# Patient Record
Sex: Male | Born: 2000
Health system: Southern US, Community
[De-identification: ages and names within clinical notes are randomized; demographics above are authoritative.]

## PROBLEM LIST (undated history)

## (undated) DIAGNOSIS — H1044 Vernal conjunctivitis: Secondary | ICD-10-CM

## (undated) DIAGNOSIS — T7840XA Allergy, unspecified, initial encounter: Secondary | ICD-10-CM

## (undated) DIAGNOSIS — F819 Developmental disorder of scholastic skills, unspecified: Secondary | ICD-10-CM

## (undated) DIAGNOSIS — L709 Acne, unspecified: Secondary | ICD-10-CM

## (undated) DIAGNOSIS — R4586 Emotional lability: Secondary | ICD-10-CM

## (undated) DIAGNOSIS — L309 Dermatitis, unspecified: Secondary | ICD-10-CM

## (undated) DIAGNOSIS — T2220XS Burn of second degree of shoulder and upper limb, except wrist and hand, unspecified site, sequela: Secondary | ICD-10-CM

## (undated) DIAGNOSIS — F4320 Adjustment disorder, unspecified: Secondary | ICD-10-CM

## (undated) DIAGNOSIS — F988 Other specified behavioral and emotional disorders with onset usually occurring in childhood and adolescence: Secondary | ICD-10-CM

## (undated) DIAGNOSIS — J45909 Unspecified asthma, uncomplicated: Secondary | ICD-10-CM

## (undated) HISTORY — DX: Developmental disorder of scholastic skills, unspecified: F81.9

## (undated) HISTORY — PX: CIRCUMCISION: SUR203

## (undated) HISTORY — DX: Vernal conjunctivitis: H10.44

## (undated) HISTORY — DX: Acne, unspecified: L70.9

## (undated) HISTORY — DX: Emotional lability: R45.86

## (undated) HISTORY — DX: Other specified behavioral and emotional disorders with onset usually occurring in childhood and adolescence: F98.8

## (undated) HISTORY — DX: Dermatitis, unspecified: L30.9

## (undated) HISTORY — DX: Burn of second degree of shoulder and upper limb, except wrist and hand, unspecified site, sequela: T22.20XS

## (undated) HISTORY — DX: Allergy, unspecified, initial encounter: T78.40XA

## (undated) HISTORY — DX: Adjustment disorder, unspecified: F43.20

---
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
## 2020-01-03 ENCOUNTER — Telehealth: Payer: Self-pay

## 2020-01-03 NOTE — Telephone Encounter (Signed)
Kenneth Davidson came in with his brother today. He stated that he had requested a dermatologist appointment and he never got it. I called the place that was listed in the referral notes and they are not accepting new patients. Could you please refer him somewhere else. Prefers something close to his home in [place] or [place].

## 2020-01-07 ENCOUNTER — Ambulatory Visit: Payer: Medicaid Other | Admitting: Family Medicine

## 2020-04-14 ENCOUNTER — Telehealth: Payer: Self-pay

## 2020-04-14 NOTE — Telephone Encounter (Signed)
Copied from CRM [id number]. Topic: Referral - Request for Referral >> Apr 13, 2020  3:56 PM Kenneth Davidson wrote: Has patient seen PCP for this complaint? Yes. Patients states PCP referred patient to a specialist before but they never received a call back.  *If NO, is insurance requiring patient see PCP for this issue before PCP can refer them? Referral for which specialty: Dermatolgist Preferred provider/office: Dr. Carmel[facility] Reason for referral: Keloids on both ears [phone]- (mother contact number)  Specialty Comments 01/24/2020 12:57 PM Marita Snellen, Nadyne Coombes, CMA - -  Note   Tommie Raymond, CMA  01/03/20 1:53 PM Note   I called the place that was listed in the referral notes and they are not accepting new patients. Could you please refer him somewhere else. Prefers something close to his home in [place] or [place].              Referral was closed due to the message listed above but will be reopened and sent to the preferred provider at [facility] (Dr. Joanna Puff).  General 04/14/2020 6:07 AM [place] Brooke Dare, Nadyne Coombes, CMA - -  Note   Referral has been sent to [facility]  P: [phone] F: [phone]  Release ID # [id number]

## 2021-06-27 DIAGNOSIS — L500 Allergic urticaria: Secondary | ICD-10-CM | POA: Diagnosis not present

## 2021-06-27 DIAGNOSIS — R001 Bradycardia, unspecified: Secondary | ICD-10-CM | POA: Diagnosis not present

## 2021-06-29 ENCOUNTER — Telehealth: Payer: Self-pay

## 2021-06-29 NOTE — Telephone Encounter (Signed)
Transition Care Management Unsuccessful Follow-up Telephone Call  Date of discharge and from where:  06/28/2021-[facility]   Attempts:  1st Attempt  Reason for unsuccessful TCM follow-up call:  Left voice message

## 2021-07-02 NOTE — Telephone Encounter (Signed)
Transition Care Management Unsuccessful Follow-up Telephone Call  Date of discharge and from where:  06/28/2021-[facility]   Attempts:  2nd Attempt  Reason for unsuccessful TCM follow-up call:  Left voice message

## 2021-07-03 NOTE — Telephone Encounter (Signed)
Transition Care Management Unsuccessful Follow-up Telephone Call  Date of discharge and from where:  06/28/2021 from [facility]  Attempts:  3rd Attempt  Reason for unsuccessful TCM follow-up call:  Missing or invalid number

## 2022-01-14 ENCOUNTER — Ambulatory Visit (LOCAL_COMMUNITY_HEALTH_CENTER): Payer: Self-pay

## 2022-01-14 ENCOUNTER — Other Ambulatory Visit: Payer: Self-pay

## 2022-01-14 DIAGNOSIS — Z111 Encounter for screening for respiratory tuberculosis: Secondary | ICD-10-CM

## 2022-01-17 ENCOUNTER — Other Ambulatory Visit: Payer: Self-pay

## 2022-01-17 ENCOUNTER — Ambulatory Visit (LOCAL_COMMUNITY_HEALTH_CENTER): Payer: Medicaid Other

## 2022-01-17 ENCOUNTER — Telehealth: Payer: Self-pay

## 2022-01-17 ENCOUNTER — Emergency Department
Admission: EM | Admit: 2022-01-17 | Discharge: 2022-01-18 | Disposition: A | Discharge status: Home / Self Care | Payer: Medicaid Other | Attending: Emergency Medicine | Admitting: Emergency Medicine

## 2022-01-17 DIAGNOSIS — T23131A Burn of first degree of multiple right fingers (nail), not including thumb, initial encounter: Secondary | ICD-10-CM

## 2022-01-17 DIAGNOSIS — J45909 Unspecified asthma, uncomplicated: Secondary | ICD-10-CM | POA: Diagnosis not present

## 2022-01-17 DIAGNOSIS — Z111 Encounter for screening for respiratory tuberculosis: Secondary | ICD-10-CM

## 2022-01-17 DIAGNOSIS — T23531A Corrosion of first degree of multiple right fingers (nail), not including thumb, initial encounter: Secondary | ICD-10-CM | POA: Insufficient documentation

## 2022-01-17 DIAGNOSIS — X12XXXA Contact with other hot fluids, initial encounter: Secondary | ICD-10-CM | POA: Diagnosis not present

## 2022-01-17 DIAGNOSIS — Y93G1 Activity, food preparation and clean up: Secondary | ICD-10-CM | POA: Insufficient documentation

## 2022-01-17 DIAGNOSIS — Y92000 Kitchen of unspecified non-institutional (private) residence as  the place of occurrence of the external cause: Secondary | ICD-10-CM | POA: Insufficient documentation

## 2022-01-17 DIAGNOSIS — T310 Burns involving less than 10% of body surface: Secondary | ICD-10-CM | POA: Diagnosis not present

## 2022-01-17 DIAGNOSIS — T594X1A Toxic effect of chlorine gas, accidental (unintentional), initial encounter: Secondary | ICD-10-CM | POA: Insufficient documentation

## 2022-01-17 DIAGNOSIS — T23101A Burn of first degree of right hand, unspecified site, initial encounter: Secondary | ICD-10-CM | POA: Diagnosis not present

## 2022-01-17 DIAGNOSIS — T23161A Burn of first degree of back of right hand, initial encounter: Secondary | ICD-10-CM | POA: Diagnosis present

## 2022-01-17 LAB — TB SKIN TEST
Induration: 0 mm
TB Skin Test: NEGATIVE

## 2022-01-17 MED ORDER — SILVER SULFADIAZINE 1 % EX CREA
TOPICAL_CREAM | Freq: Once | CUTANEOUS | Status: AC
  Administered 2022-01-17: 1 via TOPICAL
  Filled 2022-01-17: qty 20

## 2022-01-17 MED ORDER — IBUPROFEN 800 MG PO TABS
800.0000 mg | ORAL_TABLET | Freq: Once | ORAL | Status: AC
  Administered 2022-01-17: 800 mg via ORAL
  Filled 2022-01-17: qty 1

## 2022-01-17 NOTE — Telephone Encounter (Signed)
Call to client to remind him that TB skin test is due to be read today. Per client, he is on the way now. Jossie Ng, RN
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
## 2022-01-18 MED ORDER — SILVER SULFADIAZINE 1 % EX CREA: TOPICAL_CREAM | CUTANEOUS | 1 refills | Status: DC

## 2022-01-18 MED ORDER — IBUPROFEN 800 MG PO TABS: 800.0000 mg | ORAL_TABLET | Freq: Three times a day (TID) | ORAL | 0 refills | Status: DC | PRN
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
## 2022-01-21 ENCOUNTER — Telehealth: Payer: Self-pay

## 2022-01-21 NOTE — Telephone Encounter (Signed)
Transition Care Management Follow-up Telephone Call Date of discharge and from where: 01/18/2022 from [facility] How have you been since you were released from the hospital? Patient stated that he is doing well. Patient informed rx was [facility] Patient asked about transfering rx to [facility]. Informed patient to have the [facility] from the [facility].  Any questions or concerns? No  Items Reviewed: Did the pt receive and understand the discharge instructions provided? Yes  Medications obtained and verified? Yes  Other? No  Any new allergies since your discharge? No  Dietary orders reviewed? No Do you have support at home? Yes   Functional Questionnaire: (I = Independent and D = Dependent) ADLs: I  Bathing/Dressing- I  Meal Prep- I  Eating- I  Maintaining continence- I  Transferring/Ambulation- I  Managing Meds- I   Follow up appointments reviewed:  PCP Hospital f/u appt confirmed? No   Specialist Hospital f/u appt confirmed? No   Are transportation arrangements needed? No  If their condition worsens, is the pt aware to call PCP or go to the Emergency Dept.? Yes Was the patient provided with contact information for the PCP's office or ED? Yes Was to pt encouraged to call back with questions or concerns? Yes

## 2022-05-17 ENCOUNTER — Ambulatory Visit: Payer: Medicaid Other | Admitting: Family Medicine

## 2022-05-17 ENCOUNTER — Encounter: Payer: Self-pay | Admitting: Family Medicine

## 2022-05-17 VITALS — BP 112/62 | HR 82 | Resp 16 | Ht 66.0 in | Wt 152.0 lb

## 2022-05-17 DIAGNOSIS — F341 Dysthymic disorder: Secondary | ICD-10-CM | POA: Diagnosis not present

## 2022-05-17 DIAGNOSIS — L910 Hypertrophic scar: Secondary | ICD-10-CM | POA: Diagnosis not present

## 2022-05-17 DIAGNOSIS — J302 Other seasonal allergic rhinitis: Secondary | ICD-10-CM

## 2022-05-17 DIAGNOSIS — L2082 Flexural eczema: Secondary | ICD-10-CM | POA: Diagnosis not present

## 2022-05-17 DIAGNOSIS — J3089 Other allergic rhinitis: Secondary | ICD-10-CM | POA: Diagnosis not present

## 2022-05-17 DIAGNOSIS — J4520 Mild intermittent asthma, uncomplicated: Secondary | ICD-10-CM

## 2022-05-17 MED ORDER — TRIAMCINOLONE ACETONIDE 0.1 % EX CREA: 1.0000 | TOPICAL_CREAM | Freq: Two times a day (BID) | CUTANEOUS | 0 refills | Status: DC

## 2022-05-17 MED ORDER — LORATADINE 10 MG PO TABS: 10.0000 mg | ORAL_TABLET | Freq: Every day | ORAL | 5 refills | Status: DC

## 2022-05-17 MED ORDER — ALBUTEROL SULFATE HFA 108 (90 BASE) MCG/ACT IN AERS: 2.0000 | INHALATION_SPRAY | RESPIRATORY_TRACT | 0 refills | Status: DC | PRN

## 2022-08-21 NOTE — Progress Notes (Signed)
Adolescent Well Care Visit Kenneth Davidson is a 21 y.o. male who is here for well care.     PCP:  Alba Cory, MD   History was provided by the patient.  Confidentiality was discussed with the patient and, if applicable, with caregiver as well. Patient's personal or confidential phone number:    Current Issues: Current concerns include grieving, lost his father and his paternal uncle and is sad.   Nutrition: Nutrition/Eating Behaviors: eats at home, he likes fruit and vegetables - he has a garden, protein bars  Adequate calcium in diet?: discussed high calcium diet  Supplements/ Vitamins: none   Exercise/ Media: Play any Sports?:  none Exercise:   landscaper, very active job  Screen Time:  < 2 hours Media Rules or Monitoring?: no  Sleep:  Sleep: 7 hours   Social Screening: Lives with:  mother, youngest brother and one sister  Parental relations:  good - father died recently  Activities, Work, and Regulatory affairs officer?: he has his own job  Concerns regarding behavior with peers?  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  Yes, vaping, discussed importance of quitting  Secondhand smoke exposure?  no Drugs/ETOH?  no  Sexually Active?  Not currently    Pregnancy Prevention: used condoms in the past   Safe at home, in school & in relationships?  Yes Safe to self?  Yes   Screenings:  The patient completed the Rapid Assessment for Adolescent Preventive Services screening questionnaire and the following topics were identified as risk factors and discussed: mental health issues  In addition, the following topics were discussed as part of anticipatory guidance tobacco use.  PHQ-9 completed and results indicated   Flowsheet Row Office Visit from 08/22/2022 in [facility]  PHQ-9 Total Score 0         Physical Exam:  Vitals:   08/22/22 1028  BP: 110/64  Pulse: 97  Resp: 16  SpO2: 98%  Weight: 150 lb (68 kg)  Height: 5\' 7"  (1.702 m)   BP 110/64   Pulse 97   Resp 16   Ht 5\' 7"  (1.702 m)   Wt 150 lb (68 kg)   SpO2 98%   BMI 23.49 kg/m  Body mass index: body mass index is 23.49 kg/m. Growth %ile SmartLinks can only be used for patients less than 46 years old.  No results found.  Physical Exam  Constitutional: Patient appears well-developed and well-nourished. No distress.  HENT: Head: Normocephalic and atraumatic. Ears: B TMs ok, no erythema or effusion; Nose: Nose normal. Mouth/Throat: Oropharynx is clear and moist. No oropharyngeal exudate.  Eyes: Conjunctivae and EOM are normal. Pupils are equal, round, and reactive to light. No scleral icterus.  Neck: Normal range of motion. Neck supple. No JVD present. No thyromegaly present.  Cardiovascular: Normal rate, regular rhythm and normal heart sounds.  No murmur heard. No BLE edema. Pulmonary/Chest: Effort normal and breath sounds normal. No respiratory distress. Abdominal: Soft. Bowel sounds are normal, no distension. There is no tenderness. no masses MALE GENITALIA: Normal descended testes bilaterally, no masses palpated, no hernias, no lesions, no discharge RECTAL: not done  Musculoskeletal: Normal range of motion, no joint effusions. No gross deformities Neurological: he is alert and oriented to person, place, and time. No cranial nerve deficit. Coordination, balance, strength, speech and gait  are normal.  Skin: keloid ear lobes  Psychiatric: Patient has a normal mood and affect. behavior is normal. Judgment and thought content normal.   Assessment and Plan:   1. Well adult exam  - Meningococcal MCV4O(Menveo) - Pneumococcal conjugate vaccine 20-valent (Prevnar 20) - Lipid panel - CBC with Differential/Platelet - COMPLETE METABOLIC PANEL WITH GFR - Hemoglobin A1c - Hepatitis C antibody - HIV Antibody (routine  testing w rflx) - RPR - Cytology (oral, anal, urethral) ancillary only  2. Need for immunization against influenza  - Flu Vaccine QUAD 6+ mos PF IM (Fluarix Quad PF)  3. Need for pneumococcal 20-valent conjugate vaccination  - Pneumococcal conjugate vaccine 20-valent (Prevnar 20)  4. Need for vaccination for meningococcus  - Meningococcal MCV4O(Menveo)  5. Diabetes mellitus screening  - Hemoglobin A1c  6. Screening cholesterol level  - Lipid panel  7. Routine screening for STI (sexually transmitted infection)  - HIV Antibody (routine testing w rflx) - RPR - Cytology (oral, anal, urethral) ancillary only  8. Need for hepatitis C screening test  - Hepatitis C antibody  9. History of anemia  - CBC with Differential/Platelet   BMI is appropriate for age   Counseling provided for the following PCV, flu and meningococcal   vaccine components  Orders Placed This Encounter  Procedures   Flu Vaccine QUAD 6+ mos PF IM (Fluarix Quad PF)   Meningococcal MCV4O(Menveo)   Pneumococcal conjugate vaccine 20-valent (Prevnar 20)   Lipid panel   CBC with Differential/Platelet   COMPLETE METABOLIC PANEL WITH GFR   Hemoglobin A1c   Hepatitis C antibody   HIV Antibody (routine testing w rflx)   RPR     Return in about 1 year (around 08/23/2023) for CPE, sooner for regular follow up and grieving .Loistine Chance, MD

## 2022-08-21 NOTE — Patient Instructions (Signed)
Preventive Care 21-21 Years Old, Male Preventive care refers to lifestyle choices and visits with your health care provider that can promote health and wellness. Preventive care visits are also called wellness exams. What can I expect for my preventive care visit? Counseling During your preventive care visit, your health care provider may ask about your: Medical history, including: Past medical problems. Family medical history. Current health, including: Emotional well-being. Home life and relationship well-being. Sexual activity. Lifestyle, including: Alcohol, nicotine or tobacco, and drug use. Access to firearms. Diet, exercise, and sleep habits. Safety issues such as seatbelt and bike helmet use. Sunscreen use. Work and work environment. Physical exam Your health care provider may check your: Height and weight. These may be used to calculate your BMI (body mass index). BMI is a measurement that tells if you are at a healthy weight. Waist circumference. This measures the distance around your waistline. This measurement also tells if you are at a healthy weight and may help predict your risk of certain diseases, such as type 2 diabetes and high blood pressure. Heart rate and blood pressure. Body temperature. Skin for abnormal spots. What immunizations do I need?  Vaccines are usually given at various ages, according to a schedule. Your health care provider will recommend vaccines for you based on your age, medical history, and lifestyle or other factors, such as travel or where you work. What tests do I need? Screening Your health care provider may recommend screening tests for certain conditions. This may include: Lipid and cholesterol levels. Diabetes screening. This is done by checking your blood sugar (glucose) after you have not eaten for a while (fasting). Hepatitis B test. Hepatitis C test. HIV (human immunodeficiency virus) test. STI (sexually transmitted infection)  testing, if you are at risk. Talk with your health care provider about your test results, treatment options, and if necessary, the need for more tests. Follow these instructions at home: Eating and drinking  Eat a healthy diet that includes fresh fruits and vegetables, whole grains, lean protein, and low-fat dairy products. Drink enough fluid to keep your urine pale yellow. Take vitamin and mineral supplements as recommended by your health care provider. Do not drink alcohol if your health care provider tells you not to drink. If you drink alcohol: Limit how much you have to 0-2 drinks a day. Know how much alcohol is in your drink. In the [place] one drink equals one 12 oz bottle of beer (355 mL), one 5 oz glass of wine (148 mL), or one 1 oz glass of hard liquor (44 mL). Lifestyle Brush your teeth every morning and night with fluoride toothpaste. Floss one time each day. Exercise for at least 30 minutes 5 or more days each week. Do not use any products that contain nicotine or tobacco. These products include cigarettes, chewing tobacco, and vaping devices, such as e-cigarettes. If you need help quitting, ask your health care provider. Do not use drugs. If you are sexually active, practice safe sex. Use a condom or other form of protection to prevent STIs. Find healthy ways to manage stress, such as: Meditation, yoga, or listening to music. Journaling. Talking to a trusted person. Spending time with friends and family. Minimize exposure to UV radiation to reduce your risk of skin cancer. Safety Always wear your seat belt while driving or riding in a vehicle. Do not drive: If you have been drinking alcohol. Do not ride with someone who has been drinking. If you have been using any mind-altering substances   or drugs. While texting. When you are tired or distracted. Wear a helmet and other protective equipment during sports activities. If you have firearms in your house, make sure you  follow all gun safety procedures. Seek help if you have been physically or sexually abused. What's next? Go to your health care provider once a year for an annual wellness visit. Ask your health care provider how often you should have your eyes and teeth checked. Stay up to date on all vaccines. This information is not intended to replace advice given to you by your health care provider. Make sure you discuss any questions you have with your health care provider. Document Revised: 05/09/2021 Document Reviewed: 05/09/2021 Elsevier Patient Education  2023 Elsevier Inc.  

## 2022-08-22 ENCOUNTER — Ambulatory Visit (INDEPENDENT_AMBULATORY_CARE_PROVIDER_SITE_OTHER): Payer: Medicaid Other | Admitting: Family Medicine

## 2022-08-22 ENCOUNTER — Other Ambulatory Visit (HOSPITAL_COMMUNITY)
Admission: RE | Admit: 2022-08-22 | Discharge: 2022-08-22 | Disposition: A | Discharge status: Home / Self Care | Payer: Medicaid Other | Source: Ambulatory Visit | Attending: Family Medicine | Admitting: Family Medicine

## 2022-08-22 ENCOUNTER — Encounter: Payer: Self-pay | Admitting: Family Medicine

## 2022-08-22 VITALS — BP 110/64 | HR 97 | Resp 16 | Ht 67.0 in | Wt 150.0 lb

## 2022-08-22 DIAGNOSIS — Z13220 Encounter for screening for lipoid disorders: Secondary | ICD-10-CM | POA: Diagnosis not present

## 2022-08-22 DIAGNOSIS — Z862 Personal history of diseases of the blood and blood-forming organs and certain disorders involving the immune mechanism: Secondary | ICD-10-CM | POA: Diagnosis not present

## 2022-08-22 DIAGNOSIS — Z131 Encounter for screening for diabetes mellitus: Secondary | ICD-10-CM | POA: Diagnosis not present

## 2022-08-22 DIAGNOSIS — Z23 Encounter for immunization: Secondary | ICD-10-CM

## 2022-08-22 DIAGNOSIS — Z1159 Encounter for screening for other viral diseases: Secondary | ICD-10-CM | POA: Diagnosis not present

## 2022-08-22 DIAGNOSIS — Z113 Encounter for screening for infections with a predominantly sexual mode of transmission: Secondary | ICD-10-CM | POA: Insufficient documentation

## 2022-08-22 DIAGNOSIS — Z0000 Encounter for general adult medical examination without abnormal findings: Secondary | ICD-10-CM

## 2022-08-23 LAB — CYTOLOGY, (ORAL, ANAL, URETHRAL) ANCILLARY ONLY
Chlamydia: NEGATIVE
Comment: NEGATIVE
Comment: NORMAL
Neisseria Gonorrhea: NEGATIVE

## 2022-08-23 NOTE — Progress Notes (Unsigned)
Name: Kenneth Davidson   MRN: [id number]    DOB: 2001/05/29   Date:08/23/2022       Progress Note  Subjective  Chief Complaint  Follow Up  HPI  Keloid ear lobes: he had both ears pierced at age 21 and about one year later noticed that it was itchy and formed keloid that is painful and itchy, right side has improved but left side is still very large and would like to see dermatologist   Acne :doing better with mild soap at home.   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 No dizziness or cough but would like a refill of albuterol   AR: symptoms worse in the spring, doing well now , denies itchy eyes, or rhinorrhea.   Depression: phq 9 is 6 today , some of the points because of ADD, feels bad about himself , he gest on his house when not doing anything. He still feels bad about the fact that he cannot read well, he does games to help him read better but gets frustrated at times.   Patient Active Problem List   Diagnosis Date Noted   Keloid scar 05/17/2022   DDD (degenerative disc disease), lumbar 11/03/2018   Lumbar spondylosis 11/03/2018   Intermittent low back pain 03/28/2016   Cystic acne 10/02/2015   ADD (attention deficit disorder) 09/10/2015   Dermatitis, eczematoid 09/10/2015   Mild intermittent asthma with allergic rhinitis without complication 25/42/7062   Mood changes 09/10/2015   Perennial allergic rhinitis with seasonal variation 09/10/2015    Past Surgical History:  Procedure Laterality Date   CIRCUMCISION      Family History  Problem Relation Age of Onset   Depression Mother    Heart failure Father    Heart disease Father    Heart attack Father    Depression Sister     Social History   Tobacco Use   Smoking status: Never   Smokeless tobacco: Never  Substance Use Topics   Alcohol use: No    Alcohol/week: 0.0 standard drinks of alcohol     Current Outpatient Medications:     albuterol (PROAIR HFA) 108 (90 Base) MCG/ACT inhaler, Inhale 2 puffs into the lungs every 4 (four) hours as needed., Disp: 18 g, Rfl: 0   loratadine (CLARITIN) 10 MG tablet, Take 1 tablet (10 mg total) by mouth daily., Disp: 30 tablet, Rfl: 5   triamcinolone cream (KENALOG) 0.1 %, Apply 1 Application topically 2 (two) times daily., Disp: 453.6 g, Rfl: 0  Allergies  Allergen Reactions   Other     ANTS- "bumps", throat itches and swelling  BLEACH- skin "peels"   Bee Venom Swelling    I personally reviewed active problem list, medication list, allergies, family history, social history, health maintenance with the patient/caregiver today.   ROS  ***  Objective  There were no vitals filed for this visit.  There is no height or weight on file to calculate BMI.  Physical Exam ***  No results found for this or any previous visit (from the past 2160 hour(s)).   PHQ2/9:    08/22/2022   10:27 AM 05/17/2022    3:22 PM 07/06/2019    9:06 AM 07/15/2018    8:49 AM 07/15/2018    8:48 AM  Depression screen PHQ 2/9  Decreased Interest 0 0 1  0  Down, Depressed, Hopeless 0 2 3  0  PHQ - 2 Score 0 2 4  0  Altered sleeping 0 0 0 0   Tired, decreased energy 0 0 0 0   Change in appetite 0 0 0 0   Feeling bad or failure about yourself  0 2 2 0   Trouble concentrating 0 2 3 0   Moving slowly or fidgety/restless 0 0 0 0   Suicidal thoughts 0 0 0 0   PHQ-9 Score 0 6 9    Difficult doing work/chores   Very difficult Not difficult at all     phq 9 is {gen pos [id number]   Fall Risk:    08/22/2022   10:27 AM 05/17/2022    3:22 PM 07/06/2019    9:06 AM 07/15/2018    8:48 AM 03/28/2016    2:47 PM  Fall Risk   Falls in the past year? 0 0 0 No Yes  Number falls in past yr: 0 0 0  1  Injury with Fall? 0 0 0  Yes  Risk for fall due to : No Fall Risks No Fall Risks     Follow up Falls prevention discussed Falls prevention discussed         Functional Status Survey:      Assessment &  Plan  *** There are no diagnoses linked to this encounter.

## 2022-08-26 ENCOUNTER — Ambulatory Visit: Payer: Medicaid Other | Admitting: Family Medicine

## 2022-08-26 ENCOUNTER — Encounter: Payer: Self-pay | Admitting: Family Medicine

## 2022-08-26 VITALS — BP 118/66 | HR 84 | Resp 16 | Ht 67.0 in | Wt 150.0 lb

## 2022-08-26 DIAGNOSIS — Z113 Encounter for screening for infections with a predominantly sexual mode of transmission: Secondary | ICD-10-CM | POA: Diagnosis not present

## 2022-08-26 DIAGNOSIS — Z862 Personal history of diseases of the blood and blood-forming organs and certain disorders involving the immune mechanism: Secondary | ICD-10-CM | POA: Diagnosis not present

## 2022-08-26 DIAGNOSIS — Z91038 Other insect allergy status: Secondary | ICD-10-CM | POA: Diagnosis not present

## 2022-08-26 DIAGNOSIS — J4530 Mild persistent asthma, uncomplicated: Secondary | ICD-10-CM

## 2022-08-26 DIAGNOSIS — J3089 Other allergic rhinitis: Secondary | ICD-10-CM | POA: Diagnosis not present

## 2022-08-26 DIAGNOSIS — F341 Dysthymic disorder: Secondary | ICD-10-CM | POA: Insufficient documentation

## 2022-08-26 DIAGNOSIS — Z1159 Encounter for screening for other viral diseases: Secondary | ICD-10-CM | POA: Diagnosis not present

## 2022-08-26 DIAGNOSIS — J302 Other seasonal allergic rhinitis: Secondary | ICD-10-CM

## 2022-08-26 DIAGNOSIS — Z0000 Encounter for general adult medical examination without abnormal findings: Secondary | ICD-10-CM | POA: Diagnosis not present

## 2022-08-26 DIAGNOSIS — Z13220 Encounter for screening for lipoid disorders: Secondary | ICD-10-CM | POA: Diagnosis not present

## 2022-08-26 DIAGNOSIS — L910 Hypertrophic scar: Secondary | ICD-10-CM

## 2022-08-26 DIAGNOSIS — Z131 Encounter for screening for diabetes mellitus: Secondary | ICD-10-CM | POA: Diagnosis not present

## 2022-08-26 LAB — CBC WITH DIFFERENTIAL/PLATELET
Basophils Absolute: 22 cells/uL (ref 0–200)
Basophils Relative: 0.5 %
HCT: 41 % (ref 38.5–50.0)
Lymphs Abs: 1355 cells/uL (ref 850–3900)
MCHC: 35.4 g/dL (ref 32.0–36.0)
MCV: 97.2 fL (ref 80.0–100.0)
MPV: 10.4 fL (ref 7.5–12.5)
Neutro Abs: 2408 cells/uL (ref 1500–7800)
Total Lymphocyte: 31.5 %
WBC: 4.3 10*3/uL (ref 3.8–10.8)

## 2022-08-26 MED ORDER — VENTOLIN HFA 108 (90 BASE) MCG/ACT IN AERS: 2.0000 | INHALATION_SPRAY | RESPIRATORY_TRACT | 0 refills | Status: DC | PRN

## 2022-08-26 MED ORDER — MOMETASONE FURO-FORMOTEROL FUM 100-5 MCG/ACT IN AERO: 2.0000 | INHALATION_SPRAY | Freq: Two times a day (BID) | RESPIRATORY_TRACT | 2 refills | Status: DC

## 2022-08-26 MED ORDER — EPINEPHRINE 0.3 MG/0.3ML IJ SOAJ: 0.3000 mg | INTRAMUSCULAR | 1 refills | Status: DC | PRN

## 2022-08-27 LAB — HEMOGLOBIN A1C
Hgb A1c MFr Bld: 4.6 % of total Hgb (ref <5.7)
Mean Plasma Glucose: 85 mg/dL
eAG (mmol/L): 4.7 mmol/L

## 2022-08-27 LAB — CBC WITH DIFFERENTIAL/PLATELET
Absolute Monocytes: 396 cells/uL (ref 200–950)
Eosinophils Absolute: 120 cells/uL (ref 15–500)
Eosinophils Relative: 2.8 %
Hemoglobin: 14.5 g/dL (ref 13.2–17.1)
MCH: 34.4 pg — ABNORMAL HIGH (ref 27.0–33.0)
Monocytes Relative: 9.2 %
Neutrophils Relative %: 56 %
Platelets: 244 10*3/uL (ref 140–400)
RBC: 4.22 10*6/uL (ref 4.20–5.80)
RDW: 12.1 % (ref 11.0–15.0)

## 2022-08-27 LAB — COMPLETE METABOLIC PANEL WITH GFR
AG Ratio: 1.7 (calc) (ref 1.0–2.5)
ALT: 11 U/L (ref 9–46)
AST: 15 U/L (ref 10–40)
Albumin: 4 g/dL (ref 3.6–5.1)
Alkaline phosphatase (APISO): 56 U/L (ref 36–130)
BUN: 12 mg/dL (ref 7–25)
CO2: 26 mmol/L (ref 20–32)
Calcium: 9.1 mg/dL (ref 8.6–10.3)
Chloride: 105 mmol/L (ref 98–110)
Creat: 0.94 mg/dL (ref 0.60–1.24)
Globulin: 2.3 g/dL (calc) (ref 1.9–3.7)
Glucose, Bld: 81 mg/dL (ref 65–139)
Potassium: 4.2 mmol/L (ref 3.5–5.3)
Sodium: 140 mmol/L (ref 135–146)
Total Bilirubin: 1.6 mg/dL — ABNORMAL HIGH (ref 0.2–1.2)
Total Protein: 6.3 g/dL (ref 6.1–8.1)
eGFR: 118 mL/min/{1.73_m2} (ref >=60)

## 2022-08-27 LAB — LIPID PANEL
Cholesterol: 150 mg/dL (ref <200)
HDL: 64 mg/dL (ref >=40)
LDL Cholesterol (Calc): 70 mg/dL (calc)
Non-HDL Cholesterol (Calc): 86 mg/dL (calc) (ref <130)
Total CHOL/HDL Ratio: 2.3 (calc) (ref <5.0)
Triglycerides: 76 mg/dL (ref <150)

## 2022-08-27 LAB — HIV ANTIBODY (ROUTINE TESTING W REFLEX): HIV 1&2 Ab, 4th Generation: NONREACTIVE

## 2022-08-27 LAB — HEPATITIS C ANTIBODY: Hepatitis C Ab: NONREACTIVE

## 2022-08-27 LAB — RPR: RPR Ser Ql: NONREACTIVE

## 2022-11-19 NOTE — Progress Notes (Unsigned)
Name: Kenneth Davidson   MRN: [id number]    DOB: July 07, 2001   Date:11/20/2022       Progress Note  Subjective  Chief Complaint  Follow Up  HPI  Keloid ear lobes: he had both ears pierced at age 21 and about one year later noticed that it was itchy and formed keloid that is painful and itchy, right side has improved but left side is still very large, he is waiting for Dermatologist visit   Asthma mild intermittent:he has not been using Dulera or Albuterol lately, he states used to cough with weather change but not lately. Denies wheezing or SOB  Eczema : he is able to control symptoms with topical medication prn, currently doing well but needs refills   AR: symptoms worse in the  Spring and Fall. Currently doing well but needs refill of medications   Depression: phq 9 is normal now , he still feels better about himself, no longer having lack of motivation or energy.   Patient Active Problem List   Diagnosis Date Noted   Allergic to insect bites 08/26/2022   Dysthymia 08/26/2022   Keloid scar 05/17/2022   DDD (degenerative disc disease), lumbar 11/03/2018   Lumbar spondylosis 11/03/2018   Intermittent low back pain 03/28/2016   Cystic acne 10/02/2015   ADD (attention deficit disorder) 09/10/2015   Dermatitis, eczematoid 09/10/2015   Mild intermittent asthma with allergic rhinitis without complication 19/37/9024   Mood changes 09/10/2015   Perennial allergic rhinitis with seasonal variation 09/10/2015    Past Surgical History:  Procedure Laterality Date   CIRCUMCISION      Family History  Problem Relation Age of Onset   Depression Mother    Heart failure Father    Heart disease Father    Heart attack Father    Depression Sister     Social History   Tobacco Use   Smoking status: Never   Smokeless tobacco: Never  Substance Use Topics   Alcohol use: No    Alcohol/week: 0.0 standard drinks of alcohol     Current Outpatient Medications:    albuterol (VENTOLIN  HFA) 108 (90 Base) MCG/ACT inhaler, Inhale 2 puffs into the lungs every 4 (four) hours as needed for wheezing or shortness of breath., Disp: 18 g, Rfl: 0   EPINEPHrine 0.3 mg/0.3 mL IJ SOAJ injection, Inject 0.3 mg into the muscle as needed for anaphylaxis., Disp: 2 each, Rfl: 1   mometasone-formoterol (DULERA) 100-5 MCG/ACT AERO, Inhale 2 puffs into the lungs 2 (two) times daily., Disp: 13 g, Rfl: 2   loratadine (CLARITIN) 10 MG tablet, Take 1 tablet (10 mg total) by mouth daily., Disp: 30 tablet, Rfl: 5   triamcinolone cream (KENALOG) 0.1 %, Apply 1 Application topically 2 (two) times daily., Disp: 453.6 g, Rfl: 0  Allergies  Allergen Reactions   Other     ANTS- "bumps", throat itches and swelling  BLEACH- skin "peels"   Bee Venom Swelling    I personally reviewed active problem list, medication list, allergies, family history, social history, health maintenance with the patient/caregiver today.   ROS  Ten systems reviewed and is negative except as mentioned in HPI   Objective  Vitals:   11/20/22 1505  BP: 128/78  Pulse: 76  Resp: 14  Temp: 98.3 F (36.8 C)  TempSrc: Oral  SpO2: 97%  Weight: 159 lb 9.6 oz (72.4 kg)  Height: _0  (1.702 m)    Body mass index is 25 kg/m.  Physical Exam  Constitutional: Patient appears well-developed and well-nourished.  No distress.  HEENT: head atraumatic, normocephalic, pupils equal and reactive to light, neck supple Cardiovascular: Normal rate, regular rhythm and normal heart sounds.  No murmur heard. No BLE edema. Pulmonary/Chest: Effort normal and breath sounds normal. No respiratory distress. Abdominal: Soft.  There is no tenderness. Psychiatric: Patient has a normal mood and affect. behavior is normal. Judgment and thought content normal.   Recent Results (from the past 2160 hour(s))  Lipid panel     Status: None   Collection Time: 08/26/22  3:35 PM  Result Value Ref Range   Cholesterol 150 <200 mg/dL   HDL 64 > OR = 40  mg/dL   Triglycerides 76 <150 mg/dL   LDL Cholesterol (Calc) 70 mg/dL (calc)    Comment: Reference range: <100 . Desirable range <100 mg/dL for primary prevention;   <70 mg/dL for patients with CHD or diabetic patients  with > or = 2 CHD risk factors. Marland Kitchen LDL-C is now calculated using the Martin-Hopkins  calculation, which is a validated novel method providing  better accuracy than the Friedewald equation in the  estimation of LDL-C.  Cresenciano Genre et al. Annamaria Helling. 1610;960(45): 2061-2068  (http://education.[facility].com/faq/FAQ164)    Total CHOL/HDL Ratio 2.3 <5.0 (calc)   Non-HDL Cholesterol (Calc) 86 <130 mg/dL (calc)    Comment: For patients with diabetes plus 1 major ASCVD risk  factor, treating to a non-HDL-C goal of <100 mg/dL  (LDL-C of <70 mg/dL) is considered a therapeutic  option.   CBC with Differential/Platelet     Status: Abnormal   Collection Time: 08/26/22  3:35 PM  Result Value Ref Range   WBC 4.3 3.8 - 10.8 Thousand/uL   RBC 4.22 4.20 - 5.80 Million/uL   Hemoglobin 14.5 13.2 - 17.1 g/dL   HCT 41.0 38.5 - 50.0 %   MCV 97.2 80.0 - 100.0 fL   MCH 34.4 (H) 27.0 - 33.0 pg   MCHC 35.4 32.0 - 36.0 g/dL   RDW 12.1 11.0 - 15.0 %   Platelets 244 140 - 400 Thousand/uL   MPV 10.4 7.5 - 12.5 fL   Neutro Abs 2,408 1,500 - 7,800 cells/uL   Lymphs Abs 1,355 850 - 3,900 cells/uL   Absolute Monocytes 396 200 - 950 cells/uL   Eosinophils Absolute 120 15 - 500 cells/uL   Basophils Absolute 22 0 - 200 cells/uL   Neutrophils Relative % 56 %   Total Lymphocyte 31.5 %   Monocytes Relative 9.2 %   Eosinophils Relative 2.8 %   Basophils Relative 0.5 %  COMPLETE METABOLIC PANEL WITH GFR     Status: Abnormal   Collection Time: 08/26/22  3:35 PM  Result Value Ref Range   Glucose, Bld 81 65 - 139 mg/dL    Comment: .        Non-fasting reference interval .    BUN 12 7 - 25 mg/dL   Creat 0.94 0.60 - 1.24 mg/dL   eGFR 118 > OR = 60 mL/min/1.1m   BUN/Creatinine Ratio SEE NOTE:  6 - 22 (calc)    Comment:    Not Reported: BUN and Creatinine are within    reference range. .    Sodium 140 135 - 146 mmol/L   Potassium 4.2 3.5 - 5.3 mmol/L   Chloride 105 98 - 110 mmol/L   CO2 26 20 - 32 mmol/L   Calcium 9.1 8.6 - 10.3 mg/dL   Total Protein 6.3 6.1 - 8.1 g/dL   Albumin  4.0 3.6 - 5.1 g/dL   Globulin 2.3 1.9 - 3.7 g/dL (calc)   AG Ratio 1.7 1.0 - 2.5 (calc)   Total Bilirubin 1.6 (H) 0.2 - 1.2 mg/dL   Alkaline phosphatase (APISO) 56 36 - 130 U/L   AST 15 10 - 40 U/L   ALT 11 9 - 46 U/L  Hemoglobin A1c     Status: None   Collection Time: 08/26/22  3:35 PM  Result Value Ref Range   Hgb A1c MFr Bld 4.6 <5.7 % of total Hgb    Comment: For the purpose of screening for the presence of diabetes: . <5.7%       Consistent with the absence of diabetes 5.7-6.4%    Consistent with increased risk for diabetes             (prediabetes) > or =6.5%  Consistent with diabetes . This assay result is consistent with a decreased risk of diabetes. . Currently, no consensus exists regarding use of hemoglobin A1c for diagnosis of diabetes in children. . According to American Diabetes Association (ADA) guidelines, hemoglobin A1c <7.0% represents optimal control in non-pregnant diabetic patients. Different metrics may apply to specific patient populations.  Standards of Medical Care in Diabetes(ADA). .    Mean Plasma Glucose 85 mg/dL   eAG (mmol/L) 4.7 mmol/L  Hepatitis C antibody     Status: None   Collection Time: 08/26/22  3:35 PM  Result Value Ref Range   Hepatitis C Ab NON-REACTIVE NON-REACTIVE    Comment: . HCV antibody was non-reactive. There is no laboratory  evidence of HCV infection. . In most cases, no further action is required. However, if recent HCV exposure is suspected, a test for HCV RNA (test code [phone]) is suggested. . For additional information please refer to http://education.[facility].com/faq/FAQ22v1 (This link is being provided for  informational/ educational purposes only.) .   HIV Antibody (routine testing w rflx)     Status: None   Collection Time: 08/26/22  3:35 PM  Result Value Ref Range   HIV 1&2 Ab, 4th Generation NON-REACTIVE NON-REACTIVE    Comment: HIV-1 antigen and HIV-1/HIV-2 antibodies were not detected. There is no laboratory evidence of HIV infection. Marland Kitchen PLEASE NOTE: This information has been disclosed to you from records whose confidentiality may be protected by state law.  If your state requires such protection, then the state law prohibits you from making any further disclosure of the information without the specific written consent of the person to whom it pertains, or as otherwise permitted by law. A general authorization for the release of medical or other information is NOT sufficient for this purpose. . For additional information please refer to http://education.[facility].com/faq/FAQ106 (This link is being provided for informational/ educational purposes only.) . Marland Kitchen The performance of this assay has not been clinically validated in patients less than 74 years old. .   RPR     Status: None   Collection Time: 08/26/22  3:35 PM  Result Value Ref Range   RPR Ser Ql NON-REACTIVE NON-REACTIVE    PHQ2/9:    11/20/2022    3:10 PM 08/26/2022    3:04 PM 08/22/2022   10:27 AM 05/17/2022    3:22 PM 07/06/2019    9:06 AM  Depression screen PHQ 2/9  Decreased Interest 0 0 0 0 1  Down, Depressed, Hopeless 0 1 0 2 3  PHQ - 2 Score 0 1 0 2 4  Altered sleeping 0 0 0 0 0  Tired, decreased energy  0 0 0 0 0  Change in appetite 0 0 0 0 0  Feeling bad or failure about yourself  0 2 0 2 2  Trouble concentrating 0 0 0 2 3  Moving slowly or fidgety/restless 0 0 0 0 0  Suicidal thoughts 0 0 0 0 0  PHQ-9 Score 0 3 0 6 9  Difficult doing work/chores     Very difficult    phq 9 is negative   Fall Risk:    11/20/2022    3:10 PM 08/26/2022    3:04 PM 08/22/2022   10:27 AM 05/17/2022     3:22 PM 07/06/2019    9:06 AM  [place] in the past year? 0 0 0 0 0  Number falls in past yr:  0 0 0 0  Injury with Fall?  0 0 0 0  Risk for fall due to : No Fall Risks No Fall Risks No Fall Risks No Fall Risks   Follow up Falls prevention discussed;Education provided;Falls evaluation completed Falls prevention discussed Falls prevention discussed Falls prevention discussed      Assessment & Plan  1. Perennial allergic rhinitis with seasonal variation  - loratadine (CLARITIN) 10 MG tablet; Take 1 tablet (10 mg total) by mouth daily.  Dispense: 30 tablet; Refill: 5  2. Flexural eczema  - triamcinolone cream (KENALOG) 0.1 %; Apply 1 Application topically 2 (two) times daily.  Dispense: 453.6 g; Refill: 0  3. Mild persistent asthma without complication  Doing well at this time, flu shot and  PCV 20 are  up to date   4. Keloid scar  He is waiting to see Dermatologist in March   5. Dysthymia  Doing well

## 2022-11-20 ENCOUNTER — Ambulatory Visit: Payer: Medicaid Other | Admitting: Family Medicine

## 2022-11-20 ENCOUNTER — Encounter: Payer: Self-pay | Admitting: Family Medicine

## 2022-11-20 VITALS — BP 128/78 | HR 76 | Temp 98.3°F | Resp 14 | Ht 67.0 in | Wt 159.6 lb

## 2022-11-20 DIAGNOSIS — F341 Dysthymic disorder: Secondary | ICD-10-CM

## 2022-11-20 DIAGNOSIS — J4520 Mild intermittent asthma, uncomplicated: Secondary | ICD-10-CM

## 2022-11-20 DIAGNOSIS — J302 Other seasonal allergic rhinitis: Secondary | ICD-10-CM

## 2022-11-20 DIAGNOSIS — J3089 Other allergic rhinitis: Secondary | ICD-10-CM | POA: Diagnosis not present

## 2022-11-20 DIAGNOSIS — L2082 Flexural eczema: Secondary | ICD-10-CM

## 2022-11-20 DIAGNOSIS — L910 Hypertrophic scar: Secondary | ICD-10-CM

## 2022-11-20 MED ORDER — LORATADINE 10 MG PO TABS: 10.0000 mg | ORAL_TABLET | Freq: Every day | ORAL | 5 refills | Status: DC

## 2022-11-20 MED ORDER — TRIAMCINOLONE ACETONIDE 0.1 % EX CREA: 1.0000 | TOPICAL_CREAM | Freq: Two times a day (BID) | CUTANEOUS | 0 refills | Status: DC

## 2023-02-18 DIAGNOSIS — L700 Acne vulgaris: Secondary | ICD-10-CM | POA: Diagnosis not present

## 2023-02-18 DIAGNOSIS — L910 Hypertrophic scar: Secondary | ICD-10-CM | POA: Diagnosis not present

## 2023-02-25 DIAGNOSIS — L910 Hypertrophic scar: Secondary | ICD-10-CM | POA: Diagnosis not present

## 2023-03-17 DIAGNOSIS — L910 Hypertrophic scar: Secondary | ICD-10-CM | POA: Diagnosis not present

## 2023-03-25 DIAGNOSIS — L910 Hypertrophic scar: Secondary | ICD-10-CM | POA: Diagnosis not present

## 2023-05-06 DIAGNOSIS — L910 Hypertrophic scar: Secondary | ICD-10-CM | POA: Diagnosis not present

## 2023-05-22 NOTE — Progress Notes (Deleted)
Name: Kenneth Davidson   MRN: [id number]    DOB: [birth year]   Date:05/22/2023       Progress Note  Subjective  Chief Complaint  Follow Up  HPI  Keloid ear lobes: he had both ears pierced at age 22 and about one year later noticed that it was itchy and formed keloid that is painful and itchy, right side has improved but left side is still very large, he is waiting for Dermatologist visit   Asthma mild intermittent:he has not been using Dulera or Albuterol lately, he states used to cough with weather change but not lately. Denies wheezing or SOB  Eczema : he is able to control symptoms with topical medication prn, currently doing well but needs refills   AR: symptoms worse in the  Spring and Fall. Currently doing well but needs refill of medications   Depression: phq 9 is normal now , he still feels better about himself, no longer having lack of motivation or energy.   Patient Active Problem List   Diagnosis Date Noted   Allergic to insect bites 08/26/2022   Dysthymia 08/26/2022   Keloid scar 05/17/2022   DDD (degenerative disc disease), lumbar 11/03/2018   Lumbar spondylosis 11/03/2018   Intermittent low back pain 03/28/2016   Cystic acne 10/02/2015   ADD (attention deficit disorder) 09/10/2015   Dermatitis, eczematoid 09/10/2015   Mild intermittent asthma with allergic rhinitis without complication 09/10/2015   Mood changes 09/10/2015   Perennial allergic rhinitis with seasonal variation 09/10/2015    Past Surgical History:  Procedure Laterality Date   CIRCUMCISION      Family History  Problem Relation Age of Onset   Depression Mother    Heart failure Father    Heart disease Father    Heart attack Father    Depression Sister     Social History   Tobacco Use   Smoking status: Never   Smokeless tobacco: Never  Substance Use Topics   Alcohol use: No    Alcohol/week: 0.0 standard drinks of alcohol     Current Outpatient Medications:    albuterol (VENTOLIN  HFA) 108 (90 Base) MCG/ACT inhaler, Inhale 2 puffs into the lungs every 4 (four) hours as needed for wheezing or shortness of breath., Disp: 18 g, Rfl: 0   EPINEPHrine 0.3 mg/0.3 mL IJ SOAJ injection, Inject 0.3 mg into the muscle as needed for anaphylaxis., Disp: 2 each, Rfl: 1   loratadine (CLARITIN) 10 MG tablet, Take 1 tablet (10 mg total) by mouth daily., Disp: 30 tablet, Rfl: 5   mometasone-formoterol (DULERA) 100-5 MCG/ACT AERO, Inhale 2 puffs into the lungs 2 (two) times daily., Disp: 13 g, Rfl: 2   triamcinolone cream (KENALOG) 0.1 %, Apply 1 Application topically 2 (two) times daily., Disp: 453.6 g, Rfl: 0  Allergies  Allergen Reactions   Other     ANTS- "bumps", throat itches and swelling  BLEACH- skin "peels"   Bee Venom Swelling    I personally reviewed active problem list, medication list, allergies, family history, social history, health maintenance with the patient/caregiver today.   ROS  ***  Objective  There were no vitals filed for this visit.  There is no height or weight on file to calculate BMI.  Physical Exam ***  No results found for this or any previous visit (from the past 2160 hour(s)).   PHQ2/9:    11/20/2022    3:10 PM 08/26/2022    3:04 PM 08/22/2022   10:27 AM 05/17/2022  3:22 PM 07/06/2019    9:06 AM  Depression screen PHQ 2/9  Decreased Interest 0 0 0 0 1  Down, Depressed, Hopeless 0 1 0 2 3  PHQ - 2 Score 0 1 0 2 4  Altered sleeping 0 0 0 0 0  Tired, decreased energy 0 0 0 0 0  Change in appetite 0 0 0 0 0  Feeling bad or failure about yourself  0 2 0 2 2  Trouble concentrating 0 0 0 2 3  Moving slowly or fidgety/restless 0 0 0 0 0  Suicidal thoughts 0 0 0 0 0  PHQ-9 Score 0 3 0 6 9  Difficult doing work/chores     Very difficult    phq 9 is {gen pos [id number]}   Fall Risk:    11/20/2022    3:10 PM 08/26/2022    3:04 PM 08/22/2022   10:27 AM 05/17/2022    3:22 PM 07/06/2019    9:06 AM  Fall Risk   Falls in the past year?  0 0 0 0 0  Number falls in past yr:  0 0 0 0  Injury with Fall?  0 0 0 0  Risk for fall due to : No Fall Risks No Fall Risks No Fall Risks No Fall Risks   Follow up Falls prevention discussed;Education provided;Falls evaluation completed Falls prevention discussed Falls prevention discussed Falls prevention discussed       Functional Status Survey:      Assessment & Plan  *** There are no diagnoses linked to this encounter.

## 2023-05-23 ENCOUNTER — Ambulatory Visit: Payer: Medicaid Other | Admitting: Family Medicine

## 2023-08-29 NOTE — Progress Notes (Unsigned)
Name: Kenneth Davidson   MRN: [id number]    DOB: [birth year]   Date:09/01/2023       Progress Note  Subjective  Chief Complaint  Annual Exam  HPI  Patient presents for annual CPE.   Diet: eats mostly at home Exercise: he has a physical job, he is a Pharmacologist care  Last Dental Exam: he is up to date  Last Eye Exam: no problems, advised to go see someone   Depression: phq 9 is negative    09/01/2023    9:47 AM 11/20/2022    3:10 PM 08/26/2022    3:04 PM 08/22/2022   10:27 AM 05/17/2022    3:22 PM  Depression screen PHQ 2/9  Decreased Interest 0 0 0 0 0  Down, Depressed, Hopeless 0 0 1 0 2  PHQ - 2 Score 0 0 1 0 2  Altered sleeping 0 0 0 0 0  Tired, decreased energy 0 0 0 0 0  Change in appetite 0 0 0 0 0  Feeling bad or failure about yourself  0 0 2 0 2  Trouble concentrating 0 0 0 0 2  Moving slowly or fidgety/restless 0 0 0 0 0  Suicidal thoughts 0 0 0 0 0  PHQ-9 Score 0 0 3 0 6    Hypertension:  BP Readings from Last 3 Encounters:  09/01/23 118/70  11/20/22 128/78  08/26/22 118/66    Obesity: Wt Readings from Last 3 Encounters:  09/01/23 153 lb (69.4 kg)  11/20/22 159 lb 9.6 oz (72.4 kg)  08/26/22 150 lb (68 kg)   BMI Readings from Last 3 Encounters:  09/01/23 23.96 kg/m  11/20/22 25.00 kg/m  08/26/22 23.49 kg/m     Lipids:  Lab Results  Component Value Date   CHOL 150 08/26/2022   CHOL 134 04/11/2017   Lab Results  Component Value Date   HDL 64 08/26/2022   Lab Results  Component Value Date   LDLCALC 70 08/26/2022   Lab Results  Component Value Date   TRIG 76 08/26/2022   Lab Results  Component Value Date   CHOLHDL 2.3 08/26/2022   No results found for: "LDLDIRECT" Glucose:  Glucose  Date Value Ref Range Status  02/01/2013 99 65 - 99 mg/dL Final   Glucose, Bld  Date Value Ref Range Status  08/26/2022 81 65 - 139 mg/dL Final    Comment:    .        Non-fasting reference interval .   04/11/2017 73 65 - 99  mg/dL Final    Flowsheet Row Office Visit from 07/15/2018 in [facility]  AUDIT-C Score 0      Single STD testing and prevention (HIV/chl/gon/syphilis): 08/26/22 Sexual history: not sexually active  Hep C Screening: 05/22/23 Skin cancer: Discussed monitoring for atypical lesions. Had keloid removed from ear lobs  Colorectal cancer: N/A  Vaccines:   HPV: up to date Tdap: 2014, due Shingrix: N/A Pneumonia: up to date Flu: due COVID-19: N/A  Advanced Care Planning: A voluntary discussion about advance care planning including the explanation and discussion of advance directives.  Discussed health care proxy and Living will, and the patient was able to identify a health care proxy as mother .  Patient does not have a living will and power of attorney of health care   Patient Active Problem List   Diagnosis Date Noted   Allergic to insect bites 08/26/2022   Dysthymia 08/26/2022   Keloid  scar 05/17/2022   DDD (degenerative disc disease), lumbar 11/03/2018   Lumbar spondylosis 11/03/2018   Intermittent low back pain 03/28/2016   Cystic acne 10/02/2015   ADD (attention deficit disorder) 09/10/2015   Dermatitis, eczematoid 09/10/2015   Mild intermittent asthma with allergic rhinitis without complication 09/10/2015   Mood changes 09/10/2015   Perennial allergic rhinitis with seasonal variation 09/10/2015    Past Surgical History:  Procedure Laterality Date   CIRCUMCISION      Family History  Problem Relation Age of Onset   Depression Mother    Heart failure Father    Heart disease Father    Heart attack Father    Depression Sister     Social History   Socioeconomic History   Marital status: Single    Spouse name: Not on file   Number of children: 0   Years of education: Not on file   Highest education level: Not on file  Occupational History   Occupation: self employed    Comment: pressure washing, landscaper  Tobacco Use   Smoking  status: Never   Smokeless tobacco: Never  Vaping Use   Vaping status: Some Days   Start date: 05/22/2021   Substances: Nicotine, Flavoring  Substance and Sexual Activity   Alcohol use: No    Alcohol/week: 0.0 standard drinks of alcohol   Drug use: No   Sexual activity: Yes    Partners: Female    Birth control/protection: Condom  Other Topics Concern   Not on file  Social History Narrative   He has ADD and struggles with reading, he has IEP, but is working    International aid/development worker of Corporate investment banker Strain: Low Risk  (09/01/2023)   Overall Financial Resource Strain (CARDIA)    Difficulty of Paying Living Expenses: Not hard at all  Food Insecurity: No Food Insecurity (09/01/2023)   Hunger Vital Sign    Worried About Running Out of Food in the Last Year: Never true    Ran Out of Food in the Last Year: Never true  Transportation Needs: No Transportation Needs (09/01/2023)   PRAPARE - Administrator, Civil Service (Medical): No    Lack of Transportation (Non-Medical): No  Physical Activity: Sufficiently Active (09/01/2023)   Exercise Vital Sign    Days of Exercise per Week: 7 days    Minutes of Exercise per Session: 120 min  Stress: No Stress Concern Present (09/01/2023)   [employer] of Occupational Health - Occupational Stress Questionnaire    Feeling of Stress : Not at all  Social Connections: Socially Isolated (09/01/2023)   Social Connection and Isolation Panel [NHANES]    Frequency of Communication with Friends and Family: Twice a week    Frequency of Social Gatherings with Friends and Family: Twice a week    Attends Religious Services: Never    Database administrator or Organizations: No    Attends Banker Meetings: Never    Marital Status: Never married  Intimate Partner Violence: Not At Risk (09/01/2023)   Humiliation, Afraid, Rape, and Kick questionnaire    Fear of Current or Ex-Partner: No    Emotionally Abused: No    Physically  Abused: No    Sexually Abused: No     Current Outpatient Medications:    albuterol (VENTOLIN HFA) 108 (90 Base) MCG/ACT inhaler, Inhale 2 puffs into the lungs every 4 (four) hours as needed for wheezing or shortness of breath., Disp: 18 g, Rfl: 0  EPINEPHrine 0.3 mg/0.3 mL IJ SOAJ injection, Inject 0.3 mg into the muscle as needed for anaphylaxis., Disp: 2 each, Rfl: 1   mometasone-formoterol (DULERA) 100-5 MCG/ACT AERO, Inhale 2 puffs into the lungs 2 (two) times daily., Disp: 13 g, Rfl: 2   triamcinolone cream (KENALOG) 0.1 %, Apply 1 Application topically 2 (two) times daily., Disp: 453.6 g, Rfl: 0   loratadine (CLARITIN) 10 MG tablet, Take 1 tablet (10 mg total) by mouth daily. (Patient not taking: Reported on 09/01/2023), Disp: 30 tablet, Rfl: 5  Allergies  Allergen Reactions   Other     ANTS- "bumps", throat itches and swelling  BLEACH- skin "peels"   Bee Venom Swelling     ROS  Constitutional: Negative for fever or weight change.  Respiratory: Negative for cough and shortness of breath.   Cardiovascular: Negative for chest pain or palpitations.  Gastrointestinal: Negative for abdominal pain, no bowel changes.  Musculoskeletal: Negative for gait problem or joint swelling.  Skin: Negative for rash.  Neurological: Negative for dizziness or headache.  No other specific complaints in a complete review of systems (except as listed in HPI above).    Objective  Vitals:   09/01/23 0946  BP: 118/70  Pulse: 73  Resp: 16  SpO2: 97%  Weight: 153 lb (69.4 kg)  Height: 5\' 7"  (1.702 m)    Body mass index is 23.96 kg/m.  Physical Exam  Constitutional: Patient appears well-developed and well-nourished. No distress.  HENT: Head: Normocephalic and atraumatic. Ears: B TMs ok, no erythema or effusion; Nose: Nose normal. Mouth/Throat: Oropharynx is clear and moist. No oropharyngeal exudate.  Eyes: Conjunctivae and EOM are normal. Pupils are equal, round, and reactive to light. No  scleral icterus.  Neck: Normal range of motion. Neck supple. No JVD present. No thyromegaly present.  Cardiovascular: Normal rate, regular rhythm and normal heart sounds.  No murmur heard. No BLE edema. Pulmonary/Chest: Effort normal and breath sounds normal. No respiratory distress. Abdominal: Soft. Bowel sounds are normal, no distension. There is no tenderness. no masses MALE GENITALIA: Normal descended testes bilaterally, no masses palpated, no hernias, no lesions, no discharge RECTAL: not done  Musculoskeletal: Normal range of motion, no joint effusions. No gross deformities Neurological: he is alert and oriented to person, place, and time. No cranial nerve deficit. Coordination, balance, strength, speech and gait are normal.  Skin: Skin is warm and dry. No rash noted. No erythema.  Psychiatric: Patient has a normal mood and affect. behavior is normal. Judgment and thought content normal.    Fall Risk:    09/01/2023    9:47 AM 11/20/2022    3:10 PM 08/26/2022    3:04 PM 08/22/2022   10:27 AM 05/17/2022    3:22 PM  Fall Risk   Falls in the past year? 0 0 0 0 0  Number falls in past yr: 0  0 0 0  Injury with Fall? 0  0 0 0  Risk for fall due to : No Fall Risks No Fall Risks No Fall Risks No Fall Risks No Fall Risks  Follow up Falls prevention discussed Falls prevention discussed;Education provided;Falls evaluation completed Falls prevention discussed Falls prevention discussed Falls prevention discussed     Functional Status Survey: Is the patient deaf or have difficulty hearing?: No Does the patient have difficulty seeing, even when wearing glasses/contacts?: No Does the patient have difficulty concentrating, remembering, or making decisions?: No Does the patient have difficulty walking or climbing stairs?: No Does the patient have  difficulty dressing or bathing?: No Does the patient have difficulty doing errands alone such as visiting a doctor's office or shopping?:  No    Assessment & Plan  1. Well adult exam   2. Need for immunization against influenza  - Flu vaccine trivalent PF, 6mos and older(Flulaval,Afluria,Fluarix,Fluzone)    -Prostate cancer screening and PSA options (with potential risks and benefits of testing vs not testing) were discussed along with recent recs/guidelines. -USPSTF grade A and B recommendations reviewed with patient; age-appropriate recommendations, preventive care, screening tests, etc discussed and encouraged; healthy living encouraged; see AVS for patient education given to patient -Discussed importance of 150 minutes of physical activity weekly, eat two servings of fish weekly, eat one serving of tree nuts ( cashews, pistachios, pecans, almonds.Marland Kitchen) every other day, eat 6 servings of fruit/vegetables daily and drink plenty of water and avoid sweet beverages.  -Reviewed Health Maintenance: yes

## 2023-09-01 ENCOUNTER — Other Ambulatory Visit: Payer: Self-pay

## 2023-09-01 ENCOUNTER — Encounter: Payer: Self-pay | Admitting: Family Medicine

## 2023-09-01 ENCOUNTER — Ambulatory Visit (INDEPENDENT_AMBULATORY_CARE_PROVIDER_SITE_OTHER): Payer: Medicaid Other | Admitting: Family Medicine

## 2023-09-01 VITALS — BP 118/70 | HR 73 | Resp 16 | Ht 67.0 in | Wt 153.0 lb

## 2023-09-01 DIAGNOSIS — Z23 Encounter for immunization: Secondary | ICD-10-CM

## 2023-09-01 DIAGNOSIS — Z0000 Encounter for general adult medical examination without abnormal findings: Secondary | ICD-10-CM

## 2023-09-01 DIAGNOSIS — Z91038 Other insect allergy status: Secondary | ICD-10-CM

## 2023-09-01 MED ORDER — EPINEPHRINE 0.3 MG/0.3ML IJ SOAJ: 0.3000 mg | INTRAMUSCULAR | 1 refills | Status: AC | PRN

## 2023-10-22 NOTE — Progress Notes (Unsigned)
Name: Kenneth Davidson   MRN: [id number]    DOB: 08/20/2001   Date:10/22/2023       Progress Note  Subjective  Chief Complaint  No chief complaint on file.   HPI  *** Patient Active Problem List   Diagnosis Date Noted   Allergic to insect bites 08/26/2022   Dysthymia 08/26/2022   Keloid scar 05/17/2022   DDD (degenerative disc disease), lumbar 11/03/2018   Lumbar spondylosis 11/03/2018   Intermittent low back pain 03/28/2016   Cystic acne 10/02/2015   ADD (attention deficit disorder) 09/10/2015   Dermatitis, eczematoid 09/10/2015   Mild intermittent asthma with allergic rhinitis without complication 09/10/2015   Mood changes 09/10/2015   Perennial allergic rhinitis with seasonal variation 09/10/2015    Past Surgical History:  Procedure Laterality Date   CIRCUMCISION      Family History  Problem Relation Age of Onset   Depression Mother    Heart failure Father    Heart disease Father    Heart attack Father    Depression Sister     Social History   Tobacco Use   Smoking status: Never   Smokeless tobacco: Never  Substance Use Topics   Alcohol use: No    Alcohol/week: 0.0 standard drinks of alcohol     Current Outpatient Medications:    albuterol (VENTOLIN HFA) 108 (90 Base) MCG/ACT inhaler, Inhale 2 puffs into the lungs every 4 (four) hours as needed for wheezing or shortness of breath., Disp: 18 g, Rfl: 0   EPINEPHrine 0.3 mg/0.3 mL IJ SOAJ injection, Inject 0.3 mg into the muscle as needed for anaphylaxis., Disp: 2 each, Rfl: 1   loratadine (CLARITIN) 10 MG tablet, Take 1 tablet (10 mg total) by mouth daily. (Patient not taking: Reported on 09/01/2023), Disp: 30 tablet, Rfl: 5   mometasone-formoterol (DULERA) 100-5 MCG/ACT AERO, Inhale 2 puffs into the lungs 2 (two) times daily., Disp: 13 g, Rfl: 2   triamcinolone cream (KENALOG) 0.1 %, Apply 1 Application topically 2 (two) times daily., Disp: 453.6 g, Rfl: 0  Allergies  Allergen Reactions   Other      ANTS- "bumps", throat itches and swelling  BLEACH- skin "peels"   Bee Venom Swelling    I personally reviewed {Reviewed:14835} with the patient/caregiver today.   ROS  ***  Objective  There were no vitals filed for this visit.  There is no height or weight on file to calculate BMI.  Physical Exam ***  No results found for this or any previous visit (from the past 2160 hour(s)).  Diabetic Foot Exam: Diabetic Foot Exam - Simple   No data filed    ***  PHQ2/9:    09/01/2023    9:47 AM 11/20/2022    3:10 PM 08/26/2022    3:04 PM 08/22/2022   10:27 AM 05/17/2022    3:22 PM  Depression screen PHQ 2/9  Decreased Interest 0 0 0 0 0  Down, Depressed, Hopeless 0 0 1 0 2  PHQ - 2 Score 0 0 1 0 2  Altered sleeping 0 0 0 0 0  Tired, decreased energy 0 0 0 0 0  Change in appetite 0 0 0 0 0  Feeling bad or failure about yourself  0 0 2 0 2  Trouble concentrating 0 0 0 0 2  Moving slowly or fidgety/restless 0 0 0 0 0  Suicidal thoughts 0 0 0 0 0  PHQ-9 Score 0 0 3 0 6    phq 9  is {gen pos [id number]} ***  Fall Risk:    09/01/2023    9:47 AM 11/20/2022    3:10 PM 08/26/2022    3:04 PM 08/22/2022   10:27 AM 05/17/2022    3:22 PM  Fall Risk   Falls in the past year? 0 0 0 0 0  Number falls in past yr: 0  0 0 0  Injury with Fall? 0  0 0 0  Risk for fall due to : No Fall Risks No Fall Risks No Fall Risks No Fall Risks No Fall Risks  Follow up Falls prevention discussed Falls prevention discussed;Education provided;Falls evaluation completed Falls prevention discussed Falls prevention discussed Falls prevention discussed   ***   Functional Status Survey:   ***   Assessment & Plan  *** There are no diagnoses linked to this encounter.

## 2023-10-28 ENCOUNTER — Ambulatory Visit: Payer: Medicaid Other | Admitting: Family Medicine

## 2023-10-28 DIAGNOSIS — Z23 Encounter for immunization: Secondary | ICD-10-CM

## 2024-03-03 ENCOUNTER — Telehealth: Payer: Self-pay

## 2024-03-03 NOTE — Telephone Encounter (Signed)
 Copied from CRM [id number]. Topic: General - Call Back - No Documentation >> Mar 03, 2024  2:42 PM Kenneth Davidson wrote: Patient is requesting a callback to discuss why he can't get his emotional support animal paperwork filled out so that his dog may live with him at his apartment complex. Front desk advised he would need to speak with his therapist or psychiatrist to get this paperwork filled out.  Callback #: [phone]

## 2024-03-03 NOTE — Telephone Encounter (Signed)
 Spoke to patient and advised no provider in this office does them. Patient put his Girlfriend on speaker and was telling me her provider was able to do it for her. I re stated we do not do those here. Pt can call back to make an appointment for physiatry. Pt and girlfriend verbalized understanding

## 2024-03-25 ENCOUNTER — Ambulatory Visit: Payer: Self-pay

## 2024-03-25 NOTE — Telephone Encounter (Signed)
  Chief Complaint: Chest Pain Symptoms: SOB with episodes, brief Frequency: Intermittent x 2 weeks Pertinent Negatives: Patient denies fever, HA, vision changes, sweating Disposition: [] ED /[] Urgent Care (no appt availability in office) / [x] Appointment(In office/virtual)/ []  [facility]/ [] Home Care/ [] Refused Recommended Disposition /[] [facility]/ []  Follow-up with PCP Additional Notes: Pt reports he has been experiencing intermittent left sided CP for about 2 weeks. Notes the pain lasts about 2 minutes, is 6/10 and is accompanied by a brief period of SOB. Pt denies any current symptoms at this time. Advised to call back or seek emergency treatment if symptoms return. OV scheduled. This RN educated pt on home care, new-worsening symptoms, when to call back/seek emergent care. Pt verbalized understanding and agrees to plan.    Copied from CRM [id number]. Topic: Clinical - Red Word Triage >> Mar 25, 2024  4:30 PM Stanly Early wrote: Red Word that prompted transfer to Nurse Triage: Chest pain started last week, history of chest pain runs in family Reason for Disposition  [1] Chest pain lasts < 5 minutes AND [2] NO chest pain or cardiac symptoms (e.g., breathing difficulty, sweating) now  (Exception: Chest pains that last only a few seconds.)  Answer Assessment - Initial Assessment Questions 1. LOCATION: "Where does it hurt?"       Left side chest 2. RADIATION: "Does the pain go anywhere else?" (e.g., into neck, jaw, arms, back)     Radiates to upper chest, left arm 3. ONSET: "When did the chest pain begin?" (Minutes, hours or days)      X 2 weeks ago 4. PATTERN: "Does the pain come and go, or has it been constant since it started?"  "Does it get worse with exertion?"      Intermittent 5. DURATION: "How long does it last" (e.g., seconds, minutes, hours)     "About 2 minutes" 6. SEVERITY: "How bad is the pain?"  (e.g., Scale 1-10; mild, moderate, or severe)    - MILD (1-3):  doesn't interfere with normal activities     - MODERATE (4-7): interferes with normal activities or awakens from sleep    - SEVERE (8-10): excruciating pain, unable to do any normal activities       6/10 7. CARDIAC RISK FACTORS: "Do you have any history of heart problems or risk factors for heart disease?" (e.g., angina, prior heart attack; diabetes, high blood pressure, high cholesterol, smoker, or strong family history of heart disease)     Pt reports he was vaping when symptoms started but has since quit 8. PULMONARY RISK FACTORS: "Do you have any history of lung disease?"  (e.g., blood clots in lung, asthma, emphysema, birth control pills)     Asthma 9. CAUSE: "What do you think is causing the chest pain?"     Unknown 10. OTHER SYMPTOMS: "Do you have any other symptoms?" (e.g., dizziness, nausea, vomiting, sweating, fever, difficulty breathing, cough)       SOB during CP  Protocols used: Chest Pain-A-AH

## 2024-03-26 ENCOUNTER — Encounter: Payer: Self-pay | Admitting: Family Medicine

## 2024-03-26 ENCOUNTER — Ambulatory Visit: Admitting: Family Medicine

## 2024-03-26 VITALS — BP 124/76 | HR 67 | Resp 16 | Ht 67.0 in | Wt 154.8 lb

## 2024-03-26 DIAGNOSIS — L2082 Flexural eczema: Secondary | ICD-10-CM

## 2024-03-26 DIAGNOSIS — J302 Other seasonal allergic rhinitis: Secondary | ICD-10-CM

## 2024-03-26 DIAGNOSIS — J4530 Mild persistent asthma, uncomplicated: Secondary | ICD-10-CM

## 2024-03-26 DIAGNOSIS — R0789 Other chest pain: Secondary | ICD-10-CM

## 2024-03-26 DIAGNOSIS — J3089 Other allergic rhinitis: Secondary | ICD-10-CM

## 2024-03-26 MED ORDER — MOMETASONE FURO-FORMOTEROL FUM 100-5 MCG/ACT IN AERO: 2.0000 | INHALATION_SPRAY | Freq: Two times a day (BID) | RESPIRATORY_TRACT | 0 refills | Status: AC

## 2024-03-26 MED ORDER — TRIAMCINOLONE ACETONIDE 0.1 % EX CREA: 1.0000 | TOPICAL_CREAM | Freq: Two times a day (BID) | CUTANEOUS | 0 refills | Status: AC

## 2024-03-26 MED ORDER — LORATADINE 10 MG PO TABS: 10.0000 mg | ORAL_TABLET | Freq: Every day | ORAL | 5 refills | Status: AC

## 2024-03-26 MED ORDER — VENTOLIN HFA 108 (90 BASE) MCG/ACT IN AERS: 1.0000 | INHALATION_SPRAY | RESPIRATORY_TRACT | 0 refills | Status: AC | PRN

## 2024-03-26 NOTE — Progress Notes (Signed)
 Name: Kenneth Davidson   MRN: [id number]    DOB: May 15, 2001   Date:03/26/2024       Progress Note  Subjective  Chief Complaint  Chief Complaint  Patient presents with   Chest Pain    intermittent left sided CP for about 2 weeks. Notes the pain lasts about 2 minutes  NO SOB of today    Discussed the use of AI scribe software for clinical note transcription with the patient, who gave verbal consent to proceed.  History of Present Illness Kenneth Davidson is a 23 year old male with asthma who presents with chest pain.  Three weeks ago, he experienced a sharp, stinging chest pain on the left side while sitting in the back seat of a car. The pain was described as feeling like 'electricity hit you' and lasted about two minutes. During the episode, he had panting and difficulty breathing, which improved with deep, slow breaths. He used an albuterol  inhaler, borrowed from his girlfriend's aunt, to alleviate the symptoms. He denies having experienced this type of pain before and states it is different from his usual asthma flares. No coughing or wheezing was noted during the episode, but he did feel shortness of breath. A similar, less intense pain occurred while sitting in the lobby today.  He has a history of asthma and used to take Dulera twice daily, which he has not been using recently. He also uses Claritin  for allergies and has a history of eczema, though he is not currently experiencing issues with it. He has been vaping on and off since March, which he stopped two weeks ago after the chest pain episode. He also has a history of using marijuana occasionally to help with sleep.  He reports nasal congestion last night, which he attributes to allergies, and mentions that he used to take nasal spray but has stopped. He does not smoke cigarettes but has recently started and stopped vaping nicotine.  He mentions experiencing mood changes and a history of ADHD medication use in the past. He also  reports back pain, which he attributes to muscle tightness and lack of stretching.    Patient Active Problem List   Diagnosis Date Noted   Allergic to insect bites 08/26/2022   Dysthymia 08/26/2022   Keloid scar 05/17/2022   DDD (degenerative disc disease), lumbar 11/03/2018   Lumbar spondylosis 11/03/2018   Intermittent low back pain 03/28/2016   Cystic acne 10/02/2015   ADD (attention deficit disorder) 09/10/2015   Dermatitis, eczematoid 09/10/2015   Mild intermittent asthma with allergic rhinitis without complication 09/10/2015   Mood changes 09/10/2015   Perennial allergic rhinitis with seasonal variation 09/10/2015    Social History   Tobacco Use   Smoking status: Never   Smokeless tobacco: Never  Substance Use Topics   Alcohol use: No    Alcohol/week: 0.0 standard drinks of alcohol     Current Outpatient Medications:    albuterol  (VENTOLIN  HFA) 108 (90 Base) MCG/ACT inhaler, Inhale 2 puffs into the lungs every 4 (four) hours as needed for wheezing or shortness of breath. (Patient not taking: Reported on 03/26/2024), Disp: 18 g, Rfl: 0   EPINEPHrine  0.3 mg/0.3 mL IJ SOAJ injection, Inject 0.3 mg into the muscle as needed for anaphylaxis. (Patient not taking: Reported on 03/26/2024), Disp: 2 each, Rfl: 1   loratadine  (CLARITIN ) 10 MG tablet, Take 1 tablet (10 mg total) by mouth daily. (Patient not taking: Reported on 03/26/2024), Disp: 30 tablet, Rfl: 5   mometasone -formoterol  (  DULERA) 100-5 MCG/ACT AERO, Inhale 2 puffs into the lungs 2 (two) times daily. (Patient not taking: Reported on 03/26/2024), Disp: 13 g, Rfl: 2   triamcinolone  cream (KENALOG ) 0.1 %, Apply 1 Application topically 2 (two) times daily. (Patient not taking: Reported on 03/26/2024), Disp: 453.6 g, Rfl: 0  Allergies  Allergen Reactions   Other     ANTS- "bumps", throat itches and swelling  BLEACH- skin "peels"   Bee Venom Swelling    ROS  Ten systems reviewed and is negative except as mentioned in HPI     Objective  Vitals:   03/26/24 1301  BP: 124/76  Pulse: 67  Resp: 16  SpO2: 97%  Weight: 154 lb 12.8 oz (70.2 kg)  Height: 5\' 7"  (1.702 m)    Body mass index is 24.25 kg/m.  Physical Exam  CONSTITUTIONAL: Patient appears well-developed and well-nourished. No distress. HEENT: Head atraumatic, normocephalic, neck supple. CARDIOVASCULAR: Normal rate, regular rhythm and normal heart sounds. No murmur heard. No BLE edema. PULMONARY: Effort normal and breath sounds normal. No respiratory distress. ABDOMINAL: There is no tenderness or distention. MUSCULOSKELETAL: Normal gait. Without gross motor or sensory deficit. PSYCHIATRIC: Patient has a normal mood and affect. Behavior is normal. Judgment and thought content normal.  Assessment & Plan Asthma exacerbation Intermittent chest pain and dyspnea likely due to asthma exacerbation. Recent vaping may have contributed to lung irritation and bronchial spasm. EKG normal, low likelihood of cardiac etiology. Considered pericarditis but unlikely. Anxiety may exacerbate symptoms. - Prescribed Dulera, 2 puffs in the morning and 2 puffs at night. - Prescribed albuterol  as needed, minimize use if on Dulera. - Advised cessation of vaping to prevent lung irritation. - Scheduled follow-up in 2 weeks to assess asthma management and symptoms.  Vaping-related lung irritation Recent vaping likely contributed to lung irritation and exacerbation of asthma symptoms. Vaping cessation occurred two weeks ago. - Advised strict cessation of vaping to prevent further lung irritation.  Perennial allergic rhinitis Nasal congestion likely due to perennial allergic rhinitis with seasonal variation. - Prescribed Claritin  daily for allergy management.  Anxiety Anxiety may contribute to chest pain and dyspnea, potentially leading to panic attacks. Recent life changes may increase anxiety levels. - Discussed anxiety management strategies, including deep breathing  exercises. - Plan to address anxiety and mood changes in follow-up visit.

## 2024-04-09 ENCOUNTER — Ambulatory Visit: Admitting: Family Medicine

## 2024-04-23 ENCOUNTER — Other Ambulatory Visit: Payer: Self-pay | Admitting: Family Medicine

## 2024-04-23 DIAGNOSIS — J4530 Mild persistent asthma, uncomplicated: Secondary | ICD-10-CM

## 2024-05-29 ENCOUNTER — Emergency Department (HOSPITAL_COMMUNITY)

## 2024-05-29 ENCOUNTER — Other Ambulatory Visit: Payer: Self-pay

## 2024-05-29 ENCOUNTER — Emergency Department (HOSPITAL_COMMUNITY)
Admission: EM | Admit: 2024-05-29 | Discharge: 2024-05-30 | Disposition: A | Discharge status: Home / Self Care | Attending: Student | Admitting: Student

## 2024-05-29 DIAGNOSIS — Y92410 Unspecified street and highway as the place of occurrence of the external cause: Secondary | ICD-10-CM | POA: Diagnosis not present

## 2024-05-29 DIAGNOSIS — G8911 Acute pain due to trauma: Secondary | ICD-10-CM | POA: Insufficient documentation

## 2024-05-29 DIAGNOSIS — S161XXA Strain of muscle, fascia and tendon at neck level, initial encounter: Secondary | ICD-10-CM | POA: Insufficient documentation

## 2024-05-29 DIAGNOSIS — S199XXA Unspecified injury of neck, initial encounter: Secondary | ICD-10-CM | POA: Diagnosis not present

## 2024-05-29 DIAGNOSIS — Z7951 Long term (current) use of inhaled steroids: Secondary | ICD-10-CM | POA: Insufficient documentation

## 2024-05-29 DIAGNOSIS — M542 Cervicalgia: Secondary | ICD-10-CM | POA: Diagnosis present

## 2024-05-29 DIAGNOSIS — S46912A Strain of unspecified muscle, fascia and tendon at shoulder and upper arm level, left arm, initial encounter: Secondary | ICD-10-CM | POA: Diagnosis not present

## 2024-05-29 DIAGNOSIS — J45909 Unspecified asthma, uncomplicated: Secondary | ICD-10-CM | POA: Diagnosis not present

## 2024-05-29 DIAGNOSIS — M25512 Pain in left shoulder: Secondary | ICD-10-CM | POA: Insufficient documentation

## 2024-05-29 DIAGNOSIS — S0990XA Unspecified injury of head, initial encounter: Secondary | ICD-10-CM | POA: Diagnosis not present

## 2024-05-29 MED ORDER — NAPROXEN 250 MG PO TABS
500.0000 mg | ORAL_TABLET | Freq: Once | ORAL | Status: AC
  Administered 2024-05-29: 500 mg via ORAL
  Filled 2024-05-29: qty 2

## 2024-05-29 MED ORDER — LIDOCAINE 5 % EX PTCH
1.0000 | MEDICATED_PATCH | CUTANEOUS | Status: DC
  Administered 2024-05-29: 1 via TRANSDERMAL
  Filled 2024-05-29: qty 1
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
## 2024-05-29 NOTE — ED Notes (Signed)
 Patient is resting comfortably.
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
## 2024-05-29 NOTE — ED Notes (Signed)
 Patient transported to CT

## 2024-05-30 MED ORDER — NAPROXEN 375 MG PO TABS: 375.0000 mg | ORAL_TABLET | Freq: Two times a day (BID) | ORAL | 0 refills | Status: AC

## 2024-05-30 MED ORDER — LIDOCAINE 5 % EX PTCH: 1.0000 | MEDICATED_PATCH | CUTANEOUS | 0 refills | Status: AC

## 2024-06-04 DIAGNOSIS — F419 Anxiety disorder, unspecified: Secondary | ICD-10-CM | POA: Diagnosis not present

## 2024-06-22 ENCOUNTER — Ambulatory Visit: Admitting: Family Medicine

## 2024-07-19 ENCOUNTER — Ambulatory Visit: Admitting: Family Medicine

## 2024-09-02 NOTE — Patient Instructions (Incomplete)
 Preventive Care 11-23 Years Old, Male Preventive care refers to lifestyle choices and visits with your health care provider that can promote health and wellness. Preventive care visits are also called wellness exams. What can I expect for my preventive care visit? Counseling During your preventive care visit, your health care provider may ask about your: Medical history, including: Past medical problems. Family medical history. Current health, including: Emotional well-being. Home life and relationship well-being. Sexual activity. Lifestyle, including: Alcohol, nicotine or tobacco, and drug use. Access to firearms. Diet, exercise, and sleep habits. Safety issues such as seatbelt and bike helmet use. Sunscreen use. Work and work Astronomer. Physical exam Your health care provider may check your: Height and weight. These may be used to calculate your BMI (body mass index). BMI is a measurement that tells if you are at a healthy weight. Waist circumference. This measures the distance around your waistline. This measurement also tells if you are at a healthy weight and may help predict your risk of certain diseases, such as type 2 diabetes and high blood pressure. Heart rate and blood pressure. Body temperature. Skin for abnormal spots. What immunizations do I need?  Vaccines are usually given at various ages, according to a schedule. Your health care provider will recommend vaccines for you based on your age, medical history, and lifestyle or other factors, such as travel or where you work. What tests do I need? Screening Your health care provider may recommend screening tests for certain conditions. This may include: Lipid and cholesterol levels. Diabetes screening. This is done by checking your blood sugar (glucose) after you have not eaten for a while (fasting). Hepatitis B test. Hepatitis C test. HIV (human immunodeficiency virus) test. STI (sexually transmitted infection)  testing, if you are at risk. Talk with your health care provider about your test results, treatment options, and if necessary, the need for more tests. Follow these instructions at home: Eating and drinking  Eat a healthy diet that includes fresh fruits and vegetables, whole grains, lean protein, and low-fat dairy products. Drink enough fluid to keep your urine pale yellow. Take vitamin and mineral supplements as recommended by your health care provider. Do not drink alcohol if your health care provider tells you not to drink. If you drink alcohol: Limit how much you have to 0-2 drinks a day. Know how much alcohol is in your drink. In the [place] one drink equals one 12 oz bottle of beer (355 mL), one 5 oz glass of wine (148 mL), or one 1 oz glass of hard liquor (44 mL). Lifestyle Brush your teeth every morning and night with fluoride toothpaste. Floss one time each day. Exercise for at least 30 minutes 5 or more days each week. Do not use any products that contain nicotine or tobacco. These products include cigarettes, chewing tobacco, and vaping devices, such as e-cigarettes. If you need help quitting, ask your health care provider. Do not use drugs. If you are sexually active, practice safe sex. Use a condom or other form of protection to prevent STIs. Find healthy ways to manage stress, such as: Meditation, yoga, or listening to music. Journaling. Talking to a trusted person. Spending time with friends and family. Minimize exposure to UV radiation to reduce your risk of skin cancer. Safety Always wear your seat belt while driving or riding in a vehicle. Do not drive: If you have been drinking alcohol. Do not ride with someone who has been drinking. If you have been using any mind-altering substances  or drugs. While texting. When you are tired or distracted. Wear a helmet and other protective equipment during sports activities. If you have firearms in your house, make sure you  follow all gun safety procedures. Seek help if you have been physically or sexually abused. What's next? Go to your health care provider once a year for an annual wellness visit. Ask your health care provider how often you should have your eyes and teeth checked. Stay up to date on all vaccines. This information is not intended to replace advice given to you by your health care provider. Make sure you discuss any questions you have with your health care provider. Document Revised: 05/09/2021 Document Reviewed: 05/09/2021 Elsevier Patient Education  2024 [employer].

## 2024-09-03 ENCOUNTER — Encounter: Payer: Self-pay | Admitting: Family Medicine

## 2024-09-08 ENCOUNTER — Ambulatory Visit: Admitting: Family Medicine

## 2024-11-15 DIAGNOSIS — M791 Myalgia, unspecified site: Secondary | ICD-10-CM | POA: Diagnosis not present

## 2024-11-15 DIAGNOSIS — J101 Influenza due to other identified influenza virus with other respiratory manifestations: Secondary | ICD-10-CM | POA: Diagnosis not present

## 2024-11-15 DIAGNOSIS — Z8709 Personal history of other diseases of the respiratory system: Secondary | ICD-10-CM | POA: Diagnosis not present
